# Patient Record
Sex: Female | Born: 1950 | Race: White | Hispanic: No | Marital: Married
Health system: Southern US, Community
[De-identification: ages and names within clinical notes are randomized; demographics above are authoritative.]

---

## 2003-06-10 ENCOUNTER — Other Ambulatory Visit: Admission: RE | Admit: 2003-06-10 | Discharge: 2003-06-10 | Payer: Self-pay | Admitting: Family Medicine

## 2008-03-04 ENCOUNTER — Other Ambulatory Visit: Admission: RE | Admit: 2008-03-04 | Discharge: 2008-03-04 | Payer: Self-pay | Admitting: Obstetrics and Gynecology

## 2009-04-14 ENCOUNTER — Other Ambulatory Visit: Admission: RE | Admit: 2009-04-14 | Discharge: 2009-04-14 | Payer: Self-pay | Admitting: Family Medicine

## 2012-02-24 ENCOUNTER — Other Ambulatory Visit: Payer: Self-pay | Admitting: Family Medicine

## 2012-02-24 ENCOUNTER — Other Ambulatory Visit (HOSPITAL_COMMUNITY)
Admission: RE | Admit: 2012-02-24 | Discharge: 2012-02-24 | Disposition: A | Discharge status: Home / Self Care | Payer: Managed Care, Other (non HMO) | Source: Ambulatory Visit | Attending: Family Medicine | Admitting: Family Medicine

## 2012-02-24 DIAGNOSIS — Z0000 Encounter for general adult medical examination without abnormal findings: Secondary | ICD-10-CM | POA: Insufficient documentation

## 2015-09-02 ENCOUNTER — Ambulatory Visit
Admission: RE | Admit: 2015-09-02 | Discharge: 2015-09-02 | Disposition: A | Discharge status: Home / Self Care | Payer: BLUE CROSS/BLUE SHIELD | Source: Ambulatory Visit | Attending: Family Medicine | Admitting: Family Medicine

## 2015-09-02 ENCOUNTER — Other Ambulatory Visit: Payer: Self-pay | Admitting: Family Medicine

## 2015-09-02 DIAGNOSIS — R0602 Shortness of breath: Secondary | ICD-10-CM

## 2015-09-30 ENCOUNTER — Institutional Professional Consult (permissible substitution): Payer: BLUE CROSS/BLUE SHIELD | Admitting: Internal Medicine

## 2015-10-13 ENCOUNTER — Institutional Professional Consult (permissible substitution): Payer: BLUE CROSS/BLUE SHIELD | Admitting: Internal Medicine

## 2017-02-08 DIAGNOSIS — Z23 Encounter for immunization: Secondary | ICD-10-CM | POA: Diagnosis not present

## 2017-02-15 DIAGNOSIS — Z1231 Encounter for screening mammogram for malignant neoplasm of breast: Secondary | ICD-10-CM | POA: Diagnosis not present

## 2017-06-02 DIAGNOSIS — M25551 Pain in right hip: Secondary | ICD-10-CM | POA: Diagnosis not present

## 2017-06-02 DIAGNOSIS — E785 Hyperlipidemia, unspecified: Secondary | ICD-10-CM | POA: Diagnosis not present

## 2017-06-02 DIAGNOSIS — H93A9 Pulsatile tinnitus, unspecified ear: Secondary | ICD-10-CM | POA: Diagnosis not present

## 2017-06-02 DIAGNOSIS — E559 Vitamin D deficiency, unspecified: Secondary | ICD-10-CM | POA: Diagnosis not present

## 2017-06-06 ENCOUNTER — Other Ambulatory Visit: Payer: Self-pay | Admitting: Family Medicine

## 2017-06-06 DIAGNOSIS — R0989 Other specified symptoms and signs involving the circulatory and respiratory systems: Secondary | ICD-10-CM

## 2017-06-08 ENCOUNTER — Ambulatory Visit
Admission: RE | Admit: 2017-06-08 | Discharge: 2017-06-08 | Disposition: A | Discharge status: Home / Self Care | Payer: BLUE CROSS/BLUE SHIELD | Source: Ambulatory Visit | Attending: Family Medicine | Admitting: Family Medicine

## 2017-06-08 DIAGNOSIS — I6523 Occlusion and stenosis of bilateral carotid arteries: Secondary | ICD-10-CM | POA: Diagnosis not present

## 2017-06-08 DIAGNOSIS — R0989 Other specified symptoms and signs involving the circulatory and respiratory systems: Secondary | ICD-10-CM

## 2017-06-14 ENCOUNTER — Other Ambulatory Visit: Payer: Self-pay | Admitting: Family Medicine

## 2017-06-14 DIAGNOSIS — H93A1 Pulsatile tinnitus, right ear: Secondary | ICD-10-CM

## 2017-07-18 ENCOUNTER — Ambulatory Visit
Admission: RE | Admit: 2017-07-18 | Discharge: 2017-07-18 | Disposition: A | Discharge status: Home / Self Care | Payer: BLUE CROSS/BLUE SHIELD | Source: Ambulatory Visit | Attending: Family Medicine | Admitting: Family Medicine

## 2017-07-18 DIAGNOSIS — H93A1 Pulsatile tinnitus, right ear: Secondary | ICD-10-CM | POA: Diagnosis not present

## 2018-01-23 ENCOUNTER — Other Ambulatory Visit: Payer: Self-pay | Admitting: Family Medicine

## 2018-01-23 ENCOUNTER — Other Ambulatory Visit (HOSPITAL_COMMUNITY)
Admission: RE | Admit: 2018-01-23 | Discharge: 2018-01-23 | Disposition: A | Discharge status: Home / Self Care | Payer: BLUE CROSS/BLUE SHIELD | Source: Ambulatory Visit | Attending: Family Medicine | Admitting: Family Medicine

## 2018-01-23 DIAGNOSIS — Z0000 Encounter for general adult medical examination without abnormal findings: Secondary | ICD-10-CM | POA: Diagnosis not present

## 2018-01-23 DIAGNOSIS — M8588 Other specified disorders of bone density and structure, other site: Secondary | ICD-10-CM | POA: Diagnosis not present

## 2018-01-23 DIAGNOSIS — Z13220 Encounter for screening for lipoid disorders: Secondary | ICD-10-CM | POA: Diagnosis not present

## 2018-01-23 DIAGNOSIS — Z124 Encounter for screening for malignant neoplasm of cervix: Secondary | ICD-10-CM | POA: Insufficient documentation

## 2018-01-23 DIAGNOSIS — E559 Vitamin D deficiency, unspecified: Secondary | ICD-10-CM | POA: Diagnosis not present

## 2018-01-23 DIAGNOSIS — Z23 Encounter for immunization: Secondary | ICD-10-CM | POA: Diagnosis not present

## 2018-01-25 LAB — CYTOLOGY - PAP: DIAGNOSIS: NEGATIVE

## 2018-02-01 DIAGNOSIS — M21612 Bunion of left foot: Secondary | ICD-10-CM | POA: Diagnosis not present

## 2018-02-20 DIAGNOSIS — R300 Dysuria: Secondary | ICD-10-CM | POA: Diagnosis not present

## 2018-02-20 DIAGNOSIS — N390 Urinary tract infection, site not specified: Secondary | ICD-10-CM | POA: Diagnosis not present

## 2018-03-08 DIAGNOSIS — M8588 Other specified disorders of bone density and structure, other site: Secondary | ICD-10-CM | POA: Diagnosis not present

## 2018-03-12 DIAGNOSIS — R51 Headache: Secondary | ICD-10-CM | POA: Diagnosis not present

## 2018-03-16 DIAGNOSIS — L718 Other rosacea: Secondary | ICD-10-CM | POA: Diagnosis not present

## 2018-03-16 DIAGNOSIS — L821 Other seborrheic keratosis: Secondary | ICD-10-CM | POA: Diagnosis not present

## 2018-04-06 IMAGING — US US CAROTID DUPLEX BILAT
1 series · 13 of 24 positions shown · non-contrast
Comparison: NONE AVAILABLE

CLINICAL DATA: ASYMPTOMATIC CAROTID BRUIT

EXAM:
BILATERAL CAROTID DUPLEX ULTRASOUND
TECHNIQUE: Gray scale imaging, color Doppler and duplex ultrasound were
performed of bilateral carotid and vertebral arteries in the neck.

[Series 1: us carotid duplex bilat · 0.06mm/px · 69 acquisitions, 13 frames shown]
[im 1/69]
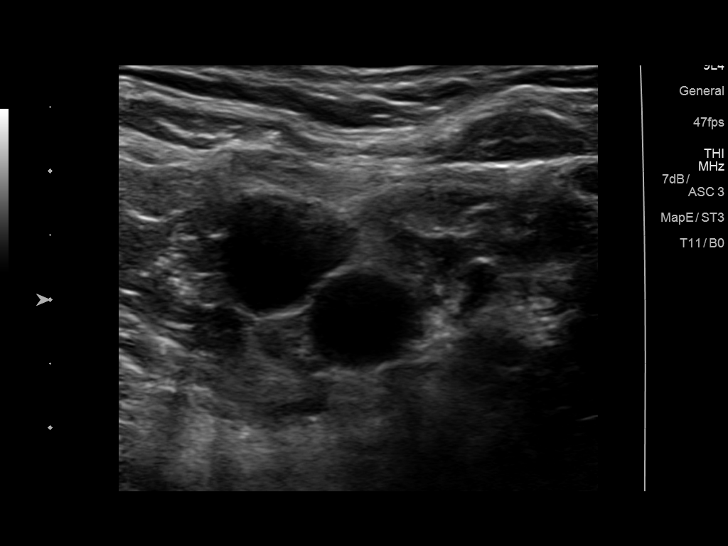
[im 6/69]
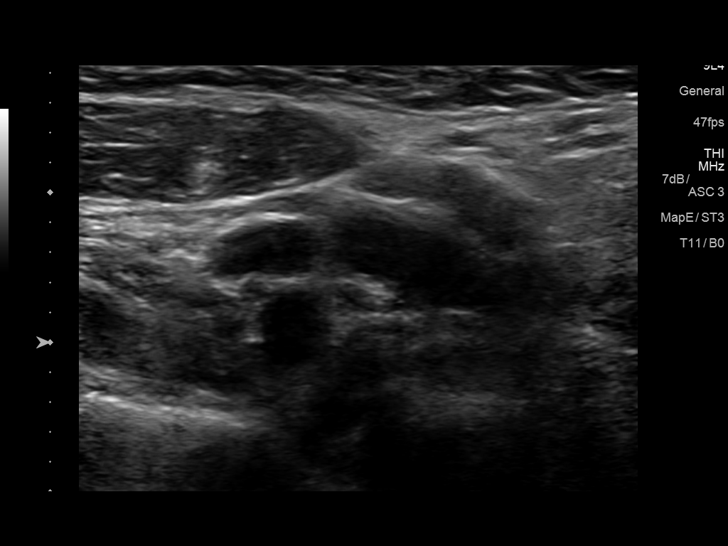
[im 12/69]
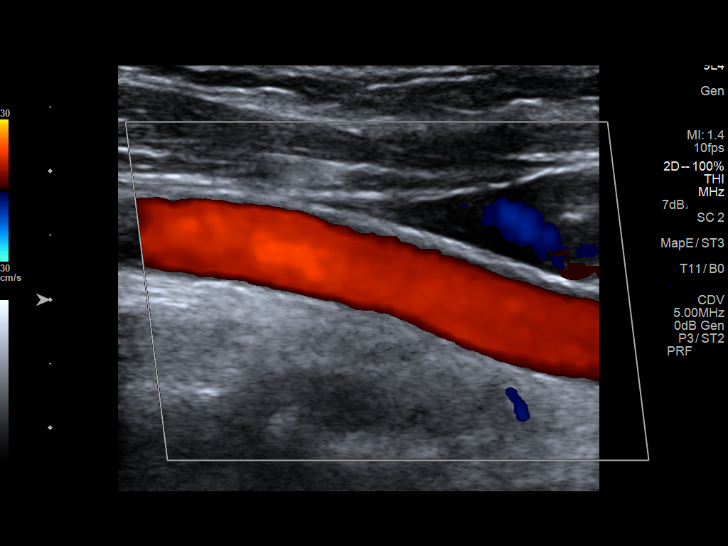
[im 18/69]
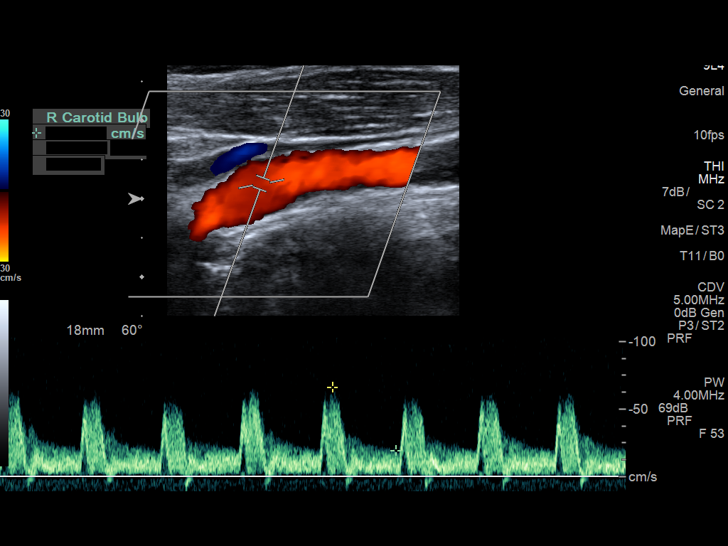
[im 24/69]
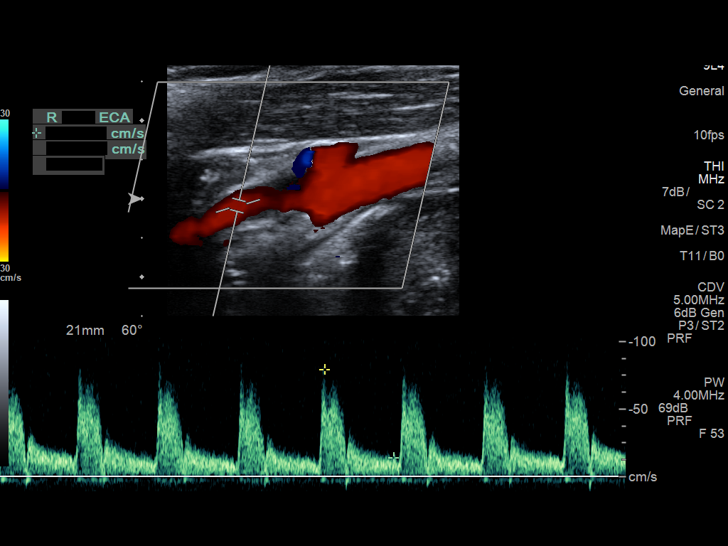
[im 30/69]
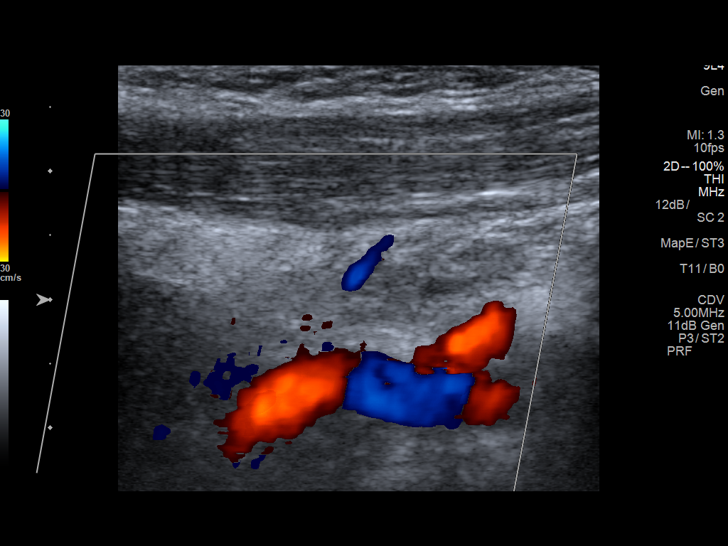
[im 39/69]
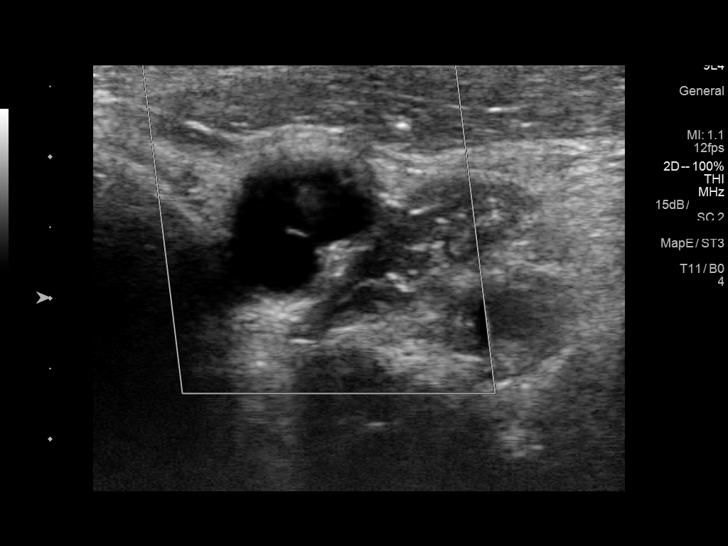
[im 42/69]
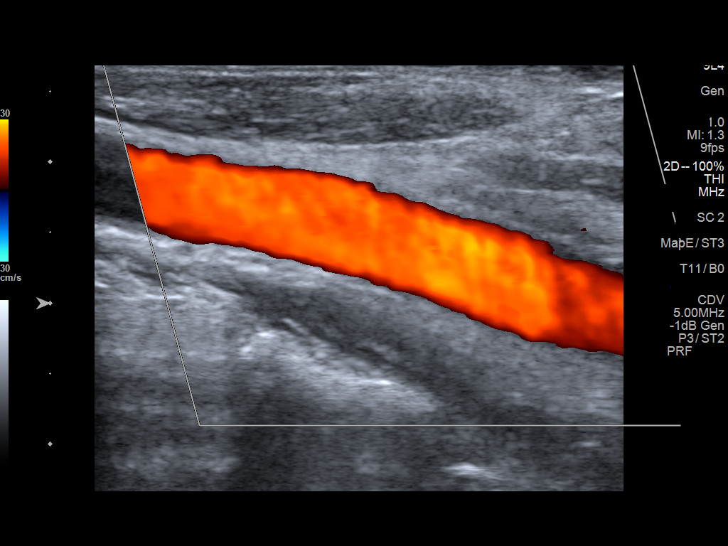
[im 48/69]
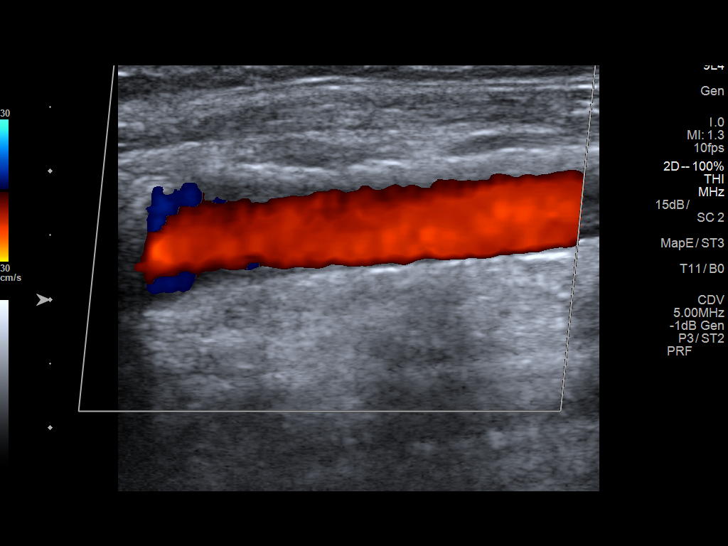
[im 54/69]
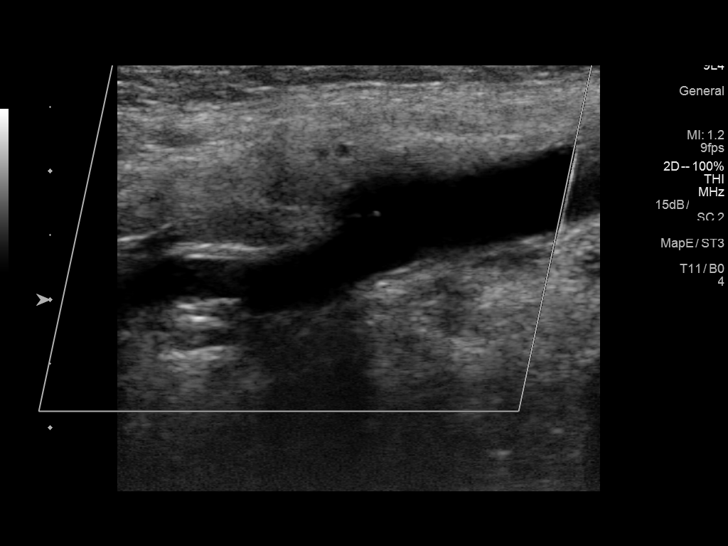
[im 57/69]
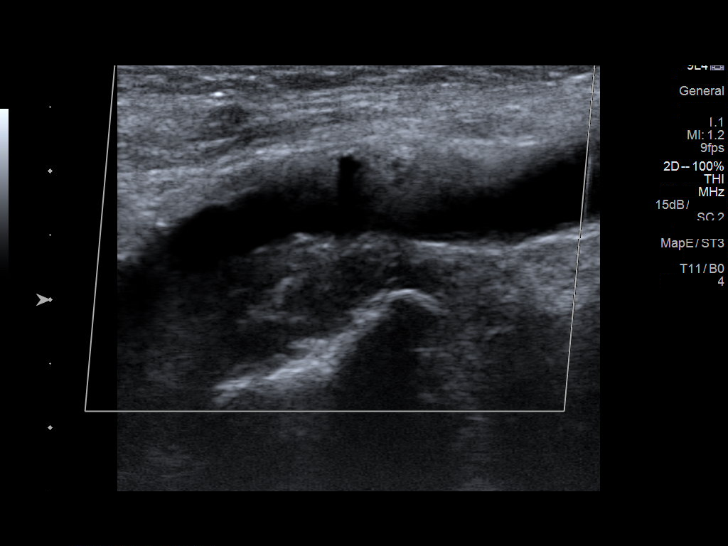
[im 63/69]
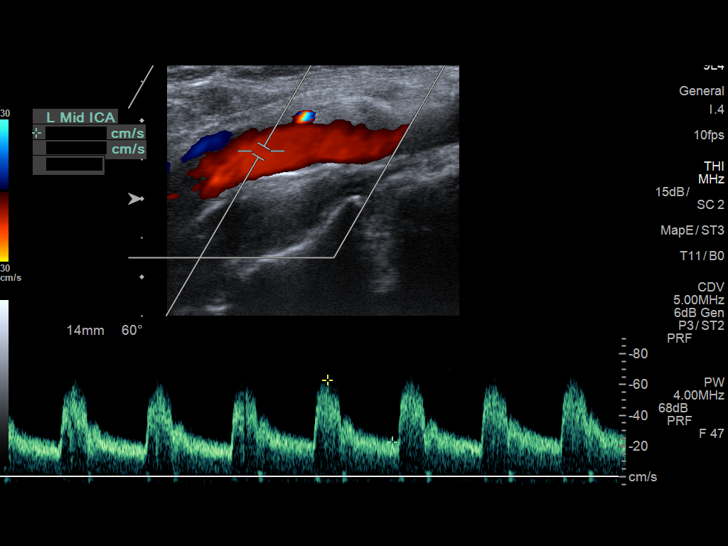
[im 69/69]
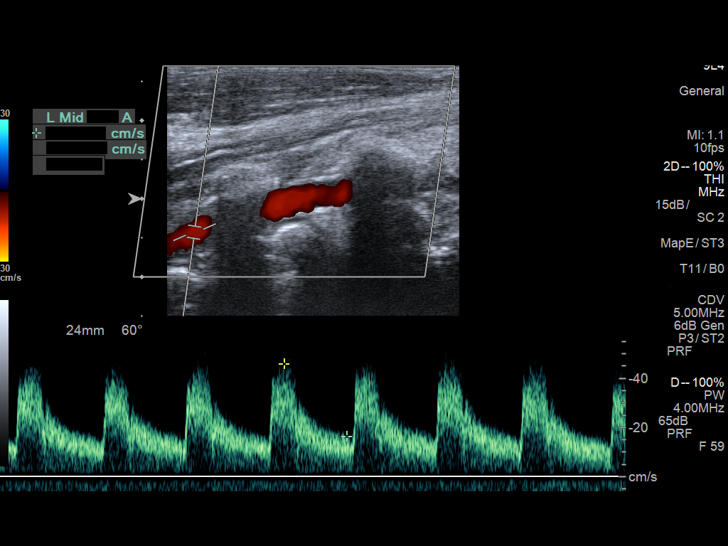

[13 of 24 positions shown; findings below may reference images not displayed]

FINDINGS: Criteria: Quantification of carotid stenosis is based on velocity
parameters that correlate the residual internal carotid diameter
with NASCET-based stenosis levels, using the diameter of the distal
internal carotid lumen as the denominator for stenosis measurement.

The following velocity measurements were obtained:

RIGHT

ICA:  123/41 cm/sec

CCA:  100/25 cm/sec

SYSTOLIC ICA/CCA RATIO:

DIASTOLIC ICA/CCA RATIO:

ECA:  79 cm/sec

LEFT

ICA:  91/34 cm/sec

CCA:  91/23 cm/sec

SYSTOLIC ICA/CCA RATIO:

DIASTOLIC ICA/CCA RATIO:

ECA:  83 cm/sec

RIGHT CAROTID ARTERY: Minor echogenic shadowing plaque formation. No
hemodynamically significant right ICA stenosis, velocity elevation,
or turbulent flow. Degree of narrowing less than 50%.

RIGHT VERTEBRAL ARTERY:  ANTEGRADE

LEFT CAROTID ARTERY: moderate heterogeneous partially calcified
right carotid bifurcation atherosclerosis. left proximal ICA appears
to have small ulcerated plaque formations. despite this, no
hemodynamically significant ICA stenosis, velocity elevation, or
turbulent flow. degree of narrowing still estimated less than 50%.

LEFT VERTEBRAL ARTERY:  ANTEGRADE
IMPRESSION: Left greater than right carotid atherosclerosis.

Mild ICA narrowings bilaterally, less than 50%.  See above comment.

Patent antegrade vertebral flow bilaterally

## 2018-06-06 DIAGNOSIS — K5730 Diverticulosis of large intestine without perforation or abscess without bleeding: Secondary | ICD-10-CM | POA: Diagnosis not present

## 2018-06-21 DIAGNOSIS — K635 Polyp of colon: Secondary | ICD-10-CM | POA: Diagnosis not present

## 2018-06-21 DIAGNOSIS — K5750 Diverticulosis of both small and large intestine without perforation or abscess without bleeding: Secondary | ICD-10-CM | POA: Diagnosis not present

## 2018-06-21 DIAGNOSIS — Z86010 Personal history of colonic polyps: Secondary | ICD-10-CM | POA: Diagnosis not present

## 2018-06-21 DIAGNOSIS — Z1211 Encounter for screening for malignant neoplasm of colon: Secondary | ICD-10-CM | POA: Diagnosis not present

## 2018-06-21 DIAGNOSIS — D122 Benign neoplasm of ascending colon: Secondary | ICD-10-CM | POA: Diagnosis not present

## 2018-06-21 DIAGNOSIS — K5730 Diverticulosis of large intestine without perforation or abscess without bleeding: Secondary | ICD-10-CM | POA: Diagnosis not present

## 2018-07-11 DIAGNOSIS — J385 Laryngeal spasm: Secondary | ICD-10-CM | POA: Diagnosis not present

## 2018-07-11 DIAGNOSIS — J387 Other diseases of larynx: Secondary | ICD-10-CM | POA: Diagnosis not present

## 2018-07-11 DIAGNOSIS — J384 Edema of larynx: Secondary | ICD-10-CM | POA: Diagnosis not present

## 2018-07-11 DIAGNOSIS — R490 Dysphonia: Secondary | ICD-10-CM | POA: Diagnosis not present

## 2018-07-11 DIAGNOSIS — R498 Other voice and resonance disorders: Secondary | ICD-10-CM | POA: Diagnosis not present

## 2018-07-11 DIAGNOSIS — J383 Other diseases of vocal cords: Secondary | ICD-10-CM | POA: Diagnosis not present

## 2018-08-03 DIAGNOSIS — R350 Frequency of micturition: Secondary | ICD-10-CM | POA: Diagnosis not present

## 2018-10-05 DIAGNOSIS — Z23 Encounter for immunization: Secondary | ICD-10-CM | POA: Diagnosis not present

## 2018-10-31 DIAGNOSIS — R300 Dysuria: Secondary | ICD-10-CM | POA: Diagnosis not present

## 2018-10-31 DIAGNOSIS — N3090 Cystitis, unspecified without hematuria: Secondary | ICD-10-CM | POA: Diagnosis not present

## 2019-02-26 DIAGNOSIS — Z0000 Encounter for general adult medical examination without abnormal findings: Secondary | ICD-10-CM | POA: Diagnosis not present

## 2019-02-26 DIAGNOSIS — E559 Vitamin D deficiency, unspecified: Secondary | ICD-10-CM | POA: Diagnosis not present

## 2019-02-26 DIAGNOSIS — Z1159 Encounter for screening for other viral diseases: Secondary | ICD-10-CM | POA: Diagnosis not present

## 2019-02-26 DIAGNOSIS — Z23 Encounter for immunization: Secondary | ICD-10-CM | POA: Diagnosis not present

## 2019-02-26 DIAGNOSIS — E785 Hyperlipidemia, unspecified: Secondary | ICD-10-CM | POA: Diagnosis not present

## 2019-07-04 ENCOUNTER — Ambulatory Visit: Payer: Medicare Other | Attending: Internal Medicine

## 2019-07-04 DIAGNOSIS — Z23 Encounter for immunization: Secondary | ICD-10-CM | POA: Insufficient documentation

## 2019-07-04 NOTE — Progress Notes (Signed)
   Covid-19 Vaccination Clinic  Name:  Alejandra Perez    MRN: [id number] DOB: 09-24-1950  07/04/2019  [name] was observed post Covid-19 immunization for 15 minutes without incidence. She was provided with Vaccine Information Sheet and instruction to access the V-Safe system.   [name] was instructed to call 911 with any severe reactions post vaccine: Marland Kitchen Difficulty breathing  . Swelling of your face and throat  . A fast heartbeat  . A bad rash all over your body  . Dizziness and weakness    Immunizations Administered    Name Date Dose VIS Date Route   Pfizer COVID-19 Vaccine 07/04/2019 11:23 AM 0.3 mL 04/19/2019 Intramuscular   Manufacturer: [employer], [employer]   Lot: [id number]   NDC: [address]-7

## 2019-07-30 ENCOUNTER — Ambulatory Visit: Payer: Medicare Other | Attending: Internal Medicine

## 2019-07-30 DIAGNOSIS — Z23 Encounter for immunization: Secondary | ICD-10-CM

## 2019-07-30 NOTE — Progress Notes (Signed)
   Covid-19 Vaccination Clinic  Name:  Alejandra Perez    MRN: [id number] DOB: Sep 05, 1950  07/30/2019  [name] was observed post Covid-19 immunization for 15 minutes without incident. She was provided with Vaccine Information Sheet and instruction to access the V-Safe system.   Ms. Risk was instructed to call 911 with any severe reactions post vaccine: Marland Kitchen Difficulty breathing  . Swelling of face and throat  . A fast heartbeat  . A bad rash all over body  . Dizziness and weakness   Immunizations Administered    Name Date Dose VIS Date Route   Pfizer COVID-19 Vaccine 07/30/2019 10:49 AM 0.3 mL 04/19/2019 Intramuscular   Manufacturer: [employer], [employer]   Lot: [id number]   NDC: [address]-7

## 2019-10-28 DIAGNOSIS — K529 Noninfective gastroenteritis and colitis, unspecified: Secondary | ICD-10-CM

## 2019-10-28 DIAGNOSIS — R9431 Abnormal electrocardiogram [ECG] [EKG]: Secondary | ICD-10-CM | POA: Diagnosis not present

## 2019-10-28 DIAGNOSIS — R55 Syncope and collapse: Secondary | ICD-10-CM | POA: Diagnosis not present

## 2019-10-28 DIAGNOSIS — D72829 Elevated white blood cell count, unspecified: Secondary | ICD-10-CM | POA: Diagnosis not present

## 2019-10-28 DIAGNOSIS — R1013 Epigastric pain: Secondary | ICD-10-CM | POA: Diagnosis not present

## 2019-10-28 DIAGNOSIS — M419 Scoliosis, unspecified: Secondary | ICD-10-CM | POA: Diagnosis not present

## 2019-10-28 DIAGNOSIS — Z79899 Other long term (current) drug therapy: Secondary | ICD-10-CM | POA: Diagnosis not present

## 2019-10-28 DIAGNOSIS — R7989 Other specified abnormal findings of blood chemistry: Secondary | ICD-10-CM | POA: Diagnosis not present

## 2019-10-28 DIAGNOSIS — Z9049 Acquired absence of other specified parts of digestive tract: Secondary | ICD-10-CM | POA: Diagnosis not present

## 2019-10-28 NOTE — ED Notes (Signed)
Pt reports she feels like she is going to pass out, abd pain with NVD.  Started tonight after dinner

## 2019-10-29 ENCOUNTER — Emergency Department: Admit: 2019-10-29 | Payer: MEDICARE | Primary: Student in an Organized Health Care Education/Training Program

## 2019-10-29 ENCOUNTER — Inpatient Hospital Stay
Admit: 2019-10-29 | Discharge: 2019-10-29 | Disposition: A | Discharge status: Home / Self Care | Payer: MEDICARE | Attending: Emergency Medicine

## 2019-10-29 DIAGNOSIS — K529 Noninfective gastroenteritis and colitis, unspecified: Secondary | ICD-10-CM | POA: Diagnosis not present

## 2019-10-29 DIAGNOSIS — D72829 Elevated white blood cell count, unspecified: Secondary | ICD-10-CM | POA: Diagnosis not present

## 2019-10-29 LAB — COMPREHENSIVE METABOLIC PANEL
ALT: 29 U/L (ref 12–78)
AST: 27 U/L (ref 15–37)
Albumin: 4 gm/dl (ref 3.4–5.0)
Alkaline Phosphatase: 57 U/L (ref 45–117)
Anion Gap: 7 mmol/L (ref 5–15)
BUN: 16 mg/dl (ref 7–25)
CO2: 27 mEq/L (ref 21–32)
Calcium: 9.3 mg/dl (ref 8.5–10.1)
Chloride: 109 mEq/L — ABNORMAL HIGH (ref 98–107)
Creatinine: 1 mg/dl (ref 0.6–1.3)
EGFR IF NonAfrican American: 58
GFR African American: >60
Glucose: 171 mg/dl — ABNORMAL HIGH (ref 74–106)
Potassium: 3.7 mEq/L (ref 3.5–5.1)
Sodium: 143 mEq/L (ref 136–145)
Total Bilirubin: 0.4 mg/dl (ref 0.2–1.0)
Total Protein: 7.1 gm/dl (ref 6.4–8.2)

## 2019-10-29 LAB — LACTIC ACID
LACTIC ACID: 2.3 mmol/L — ABNORMAL HIGH (ref 0.4–2.0)
LACTIC ACID: 1 mmol/L (ref 0.4–2.0)
Lactic Acid: 2.3 mmol/L — ABNORMAL HIGH (ref 0.4–2.0)
Lactic Acid: 1 mmol/L (ref 0.4–2.0)

## 2019-10-29 LAB — EKG 12-LEAD
Atrial Rate: 86 {beats}/min
Diagnosis: NORMAL
P Axis: 80 degrees
P-R Interval: 122 ms
Q-T Interval: 384 ms
QRS Duration: 78 ms
QTc Calculation (Bazett): 459 ms
R Axis: 78 degrees
T Axis: -18 degrees
Ventricular Rate: 86 {beats}/min

## 2019-10-29 LAB — CBC WITH AUTO DIFFERENTIAL
Basophils %: 0.2 % (ref 0–3)
Eosinophils %: 0.7 % (ref 0–5)
Hematocrit: 47.9 % (ref 37.0–50.0)
Hemoglobin: 16.2 gm/dl (ref 13.0–17.2)
Immature Granulocytes: 0.5 % (ref 0.0–3.0)
Lymphocytes %: 7.5 % — ABNORMAL LOW (ref 28–48)
MCH: 31.6 pg (ref 25.4–34.6)
MCHC: 33.8 gm/dl (ref 30.0–36.0)
MCV: 93.6 fL (ref 80.0–98.0)
MPV: 10.2 fL — ABNORMAL HIGH (ref 6.0–10.0)
Monocytes %: 4.8 % (ref 1–13)
Neutrophils %: 86.3 % — ABNORMAL HIGH (ref 34–64)
Nucleated RBCs: 0 (ref 0–0)
Platelets: 274 10*3/uL (ref 140–450)
RBC: 5.12 M/uL (ref 3.60–5.20)
RDW-SD: 48.2 — ABNORMAL HIGH (ref 36.4–46.3)
WBC: 21 10*3/uL — ABNORMAL HIGH (ref 4.0–11.0)

## 2019-10-29 LAB — TROPONIN
Troponin I: <0.015 ng/ml (ref 0.000–0.045)
Troponin I: <0.015 ng/ml (ref 0.000–0.045)
Troponin I: <0.015 ng/ml (ref 0.000–0.045)

## 2019-10-29 LAB — POCT GLUCOSE: POC Glucose: 135 mg/dL — ABNORMAL HIGH (ref 65–105)

## 2019-10-29 LAB — LIPASE
Lipase: 95 U/L (ref 73–393)
Lipase: 95 U/L (ref 73–393)

## 2019-10-29 LAB — AMB POC CREATININE: Creatinine: 1 mg/dl (ref 0.6–1.3)

## 2019-10-29 LAB — METABOLIC PANEL, COMPREHENSIVE
ALT (SGPT): 29 U/L (ref 12–78)
AST (SGOT): 27 U/L (ref 15–37)
Albumin: 4 gm/dl (ref 3.4–5.0)
Alk. phosphatase: 57 U/L (ref 45–117)
Anion gap: 7 mmol/L (ref 5–15)
BUN: 16 mg/dl (ref 7–25)
Bilirubin, total: 0.4 mg/dl (ref 0.2–1.0)
CO2: 27 mEq/L (ref 21–32)
Calcium: 9.3 mg/dl (ref 8.5–10.1)
Chloride: 109 mEq/L — ABNORMAL HIGH (ref 98–107)
Creatinine: 1 mg/dl (ref 0.6–1.3)
GFR est AA: >60
GFR est non-AA: 58
Glucose: 171 mg/dl — ABNORMAL HIGH (ref 74–106)
Potassium: 3.7 mEq/L (ref 3.5–5.1)
Protein, total: 7.1 gm/dl (ref 6.4–8.2)
Sodium: 143 mEq/L (ref 136–145)

## 2019-10-29 LAB — CBC WITH AUTOMATED DIFF
BASOPHILS: 0.2 % (ref 0–3)
EOSINOPHILS: 0.7 % (ref 0–5)
HCT: 47.9 % (ref 37.0–50.0)
HGB: 16.2 gm/dl (ref 13.0–17.2)
IMMATURE GRANULOCYTES: 0.5 % (ref 0.0–3.0)
LYMPHOCYTES: 7.5 % — ABNORMAL LOW (ref 28–48)
MCH: 31.6 pg (ref 25.4–34.6)
MCHC: 33.8 gm/dl (ref 30.0–36.0)
MCV: 93.6 fL (ref 80.0–98.0)
MONOCYTES: 4.8 % (ref 1–13)
MPV: 10.2 fL — ABNORMAL HIGH (ref 6.0–10.0)
NEUTROPHILS: 86.3 % — ABNORMAL HIGH (ref 34–64)
NRBC: 0 (ref 0–0)
PLATELET: 274 10*3/uL (ref 140–450)
RBC: 5.12 M/uL (ref 3.60–5.20)
RDW-SD: 48.2 — ABNORMAL HIGH (ref 36.4–46.3)
WBC: 21 10*3/uL — ABNORMAL HIGH (ref 4.0–11.0)

## 2019-10-29 LAB — EKG, 12 LEAD, INITIAL
Atrial Rate: 86 {beats}/min
Calculated P Axis: 80 degrees
Calculated R Axis: 78 degrees
Calculated T Axis: -18 degrees
Diagnosis: NORMAL
P-R Interval: 122 ms
Q-T Interval: 384 ms
QRS Duration: 78 ms
QTC Calculation (Bezet): 459 ms
Ventricular Rate: 86 {beats}/min

## 2019-10-29 LAB — TROPONIN I
Troponin-I: <0.015 ng/ml (ref 0.000–0.045)
Troponin-I: <0.015 ng/ml (ref 0.000–0.045)
Troponin-I: <0.015 ng/ml (ref 0.000–0.045)

## 2019-10-29 LAB — GLUCOSE, POC: Glucose (POC): 135 mg/dL — ABNORMAL HIGH (ref 65–105)

## 2019-10-29 LAB — CREATININE, POC: Creatinine: 1 mg/dl (ref 0.6–1.3)

## 2019-10-29 MED ORDER — SODIUM CHLORIDE 0.9% BOLUS IV
0.9 % | INTRAVENOUS | Status: DC
Start: 2019-10-29 — End: 2019-10-29

## 2019-10-29 MED ORDER — ACETAMINOPHEN 650 MG RECTAL SUPPOSITORY
650 mg | RECTAL | Status: DC | PRN
Start: 2019-10-29 — End: 2019-10-30

## 2019-10-29 MED ORDER — NALOXONE 0.4 MG/ML INJECTION
0.4 mg/mL | INTRAMUSCULAR | Status: DC | PRN
Start: 2019-10-29 — End: 2019-10-30

## 2019-10-29 MED ORDER — SODIUM CHLORIDE 0.9% BOLUS IV
0.9 % | INTRAVENOUS | Status: AC
Start: 2019-10-29 — End: 2019-10-29
  Administered 2019-10-29: 08:00:00 via INTRAVENOUS

## 2019-10-29 MED ORDER — ACETAMINOPHEN (TYLENOL) SOLUTION 32MG/ML
ORAL | Status: DC | PRN
Start: 2019-10-29 — End: 2019-10-30

## 2019-10-29 MED ORDER — ASPIRIN 81 MG CHEWABLE TAB
81 mg | ORAL | Status: AC
Start: 2019-10-29 — End: 2019-10-29
  Administered 2019-10-29: 09:00:00 via ORAL

## 2019-10-29 MED ORDER — ASPIRIN 81 MG CHEWABLE TAB
81 mg | ORAL | Status: AC
Start: 2019-10-29 — End: 2019-10-29

## 2019-10-29 MED ORDER — ACETAMINOPHEN 325 MG TABLET
325 mg | ORAL | Status: DC | PRN
Start: 2019-10-29 — End: 2019-10-30

## 2019-10-29 MED ORDER — SODIUM CHLORIDE 0.9 % IV
INTRAVENOUS | Status: DC
Start: 2019-10-29 — End: 2019-10-29
  Administered 2019-10-29: 11:00:00 via INTRAVENOUS

## 2019-10-29 MED ORDER — SODIUM CHLORIDE 0.9 % IV
INTRAVENOUS | Status: AC
Start: 2019-10-29 — End: 2019-10-29
  Administered 2019-10-29: 13:00:00 via INTRAVENOUS

## 2019-10-29 MED ORDER — ONDANSETRON (PF) 4 MG/2 ML INJECTION
4 mg/2 mL | Freq: Four times a day (QID) | INTRAMUSCULAR | Status: DC | PRN
Start: 2019-10-29 — End: 2019-10-30

## 2019-10-29 MED ORDER — SODIUM CHLORIDE 0.9 % IV
Freq: Once | INTRAVENOUS | Status: AC
Start: 2019-10-29 — End: 2019-10-29
  Administered 2019-10-29: 06:00:00 via INTRAVENOUS

## 2019-10-29 MED ORDER — SODIUM CHLORIDE 0.9 % IJ SYRG
Freq: Once | INTRAMUSCULAR | Status: AC
Start: 2019-10-29 — End: 2019-10-29
  Administered 2019-10-29: 06:00:00 via INTRAVENOUS

## 2019-10-29 MED ORDER — ENOXAPARIN 40 MG/0.4 ML SUB-Q SYRINGE
40 mg/0.4 mL | SUBCUTANEOUS | Status: DC
Start: 2019-10-29 — End: 2019-10-30

## 2019-10-29 MED ORDER — TRAMADOL 50 MG TAB
50 mg | Freq: Four times a day (QID) | ORAL | Status: DC | PRN
Start: 2019-10-29 — End: 2019-10-30

## 2019-10-29 MED FILL — SODIUM CHLORIDE 0.9 % IV: INTRAVENOUS | Qty: 1000

## 2019-10-29 MED FILL — ENOXAPARIN 40 MG/0.4 ML SUB-Q SYRINGE: 40 mg/0.4 mL | SUBCUTANEOUS | Qty: 0.4

## 2019-10-29 MED FILL — ASPIRIN 81 MG CHEWABLE TAB: 81 mg | ORAL | Qty: 2

## 2019-10-29 NOTE — Progress Notes (Signed)
Progress  Notes by Amelia Jo D at 10/29/19 1610                Author: Amelia Jo D  Service: NURSING  Author Type: Registered Nurse       Filed: 10/29/19 0647  Date of Service: 10/29/19 0647  Status: Signed          Editor: Rod Can (Registered Nurse)               Physical Exam   Skin:      General: Skin is warm and dry.

## 2019-10-29 NOTE — Progress Notes (Signed)
Bedside shift change report given to Turner Daniels.   (Cabin crew) by [place], RN (offgoing nurse). Report included the following information SBAR and Kardex.

## 2019-10-29 NOTE — ED Notes (Signed)
 TRANSFER - OUT REPORT:    Verbal report given on Tina Reilly  being transferred to 6W (unit) room [phone] for routine progression of care       Report consisted of patient's Situation, Background, Assessment and   Recommendations(SBAR).     Information from the following report(s) ED Summary and Recent Results was reviewed with the receiving nurse.    Lines:   Peripheral IV 10/29/19 Left Antecubital (Active)        Opportunity for questions and clarification was provided.      Patient transported with:   Monitor  Registered Nurse

## 2019-10-29 NOTE — ED Provider Notes (Signed)
ED Provider Notes by Fuller Song, PA-C at 10/29/19 0401                Author: Fuller Song, PA-C  Service: EMERGENCY  Author Type: Physician Assistant       Filed: 10/29/19 0457  Date of Service: 10/29/19 0401  Status: Attested Addendum          Editor: Sanjuan Dame (Physician Assistant)       Related Notes: Original Note by Sanjuan Dame (Physician Assistant) filed at 10/29/19  [phone]          Cosigner: Mora Bellman, MD at 10/30/19 [phone]          Attestation signed by Mora Bellman, MD at 10/30/19 [phone]          Addendum by Dr. Jacklynn Barnacle:   I have personally seen and examined this patient; I have fully participated in the care of this patient with the advanced practice provider.  I have reviewed and agree with all pertinent clinical information including history, physical exam, labs, radiographic  studies and the plan.    Very well-appearing female with complaints as below, EKG shows acute changes with inferior lateral to his inversions but are reassuring troponin.  As-impressive leukocytosis, CT scan with enteritis, given her acute EKG findings, white count, do not feel  discharge home is in her best interest.                                 [facility]   Emergency Department Treatment Report          Patient: Tina Reilly  Age: 69 y.o.  Sex: female          Date of Birth: 1950/10/02  Admit Date: 10/28/2019  PCP: Unknown, Provider     MRN: [id number]   CSN: [id number]   Attn: Mora Bellman, MD         Room: ER08/ER08  Time Dictated: 4:01 AM  APP: Karle Starch. Maycen Degregory, PA-C           Chief Complaint    Abdominal pain     History of Present Illness     69 y.o. female  presents stating that she ate fried seafood this evening around 1900 hrs. she was leaving and she felt like what she thought was "acid reflux" she describes kind of is a fullness in her upper abdomen which she has had before with reflux however this  turned to pain and then started to radiate  to her back she did feel short of breath but she was cold and clammy and nauseated felt like she was going to pass out and then had diarrhea which seemed to make it worse.  She states the pain lasted a good 4  hours before it subsided.  She states she really does not feel the pain at all now she has no complaints at this time.  She is not had symptoms like this before.  She did not take any medications at home.  Denies any leg pain or swelling.        Review of Systems     Review of Systems    Constitutional: Positive for diaphoresis.    Respiratory: Negative for cough and shortness of breath.     Cardiovascular: Positive for chest pain. Negative for leg swelling.         Pain  in the epigastrium radiating to her back did not radiate up into her chest neck jaw or arms    Gastrointestinal: Positive for abdominal pain, diarrhea  and nausea. Negative for vomiting.    Genitourinary:         No urinary symptoms    Skin: Negative for rash.    Neurological: Negative for dizziness, sensory change, focal weakness and headaches.    All other systems reviewed and are negative.           Past Medical/Surgical History     No past medical history on file.   No past surgical history on file.   Had a cholecystectomy in 1999.  Denies any history of hypertension diabetes hyperlipidemia no known heart or lung disease     Social History          Social History          Socioeconomic History         ?  Marital status:  MARRIED              Spouse name:  Not on file         ?  Number of children:  Not on file     ?  Years of education:  Not on file         ?  Highest education level:  Not on file          Social Determinants of Health          Financial Resource Strain:         ?  Difficulty of Paying Living Expenses:        Food Insecurity:         ?  Worried About Charity fundraiser in the Last Year:      ?  Arboriculturist in the Last Year:        Transportation Needs:         ?  Film/video editor (Medical):      ?  Lack of  Transportation (Non-Medical):        Physical Activity:         ?  Days of Exercise per Week:      ?  Minutes of Exercise per Session:        Stress:         ?  Feeling of Stress :        Social Connections:         ?  Frequency of Communication with Friends and Family:      ?  Frequency of Social Gatherings with Friends and Family:      ?  Attends Religious Services:      ?  Active Member of Clubs or Organizations:      ?  Attends Archivist Meetings:         ?  Marital Status:         No tobacco no recent alcohol     Family History     No family history on file.   mother had an MI in her 29s     Current Medications        Vitamins     Allergies          Allergies        Allergen  Reactions         ?  Soma [Carisoprodol]  Rash         ?  Trimethoprim  Rash             Physical Exam        Visit Vitals      BP  (!) 104/56     Pulse  79     Temp  97.7 ??F (36.5 ??C)     Resp  17        SpO2  99%        Physical Exam   Vitals reviewed.    Constitutional:        Comments: Very pleasant nontoxic   Eyes :       General: No scleral icterus.     Conjunctiva/sclera: Conjunctivae normal.      Pupils: Pupils are equal, round, and reactive to light.   Cardiovascular :       Rate and Rhythm: Normal rate and regular rhythm.      Heart sounds: No murmur heard.      Pulmonary:       Breath sounds: Normal breath sounds. No wheezing or rales.   Chest:       Chest wall: No tenderness.   Abdominal :      Palpations: Abdomen is soft.      Comments: I cannot reproduce any tenderness there is no organomegaly no masses no pulsatile masses      Musculoskeletal:      Cervical back: Neck supple. No tenderness.      Comments: Back no flank tenderness extremities are symmetric  warm and well-perfused and nontender    Skin:      General: Skin is warm and dry.   Neurological :       General: No focal deficit present.      Mental Status: She is alert and oriented to person, place, and time.    Psychiatric:         Mood and Affect: Mood  normal.         Behavior: Behavior normal.               Impression and Management Plan     69 year old female presents for the evaluation of epigastric abdominal pain radiating to her back associated with diaphoresis nausea and a feeling that she might pass out.  She states  it lasted about 4 hours and then resolved.  Denies symptoms currently.  This is a new problem for her.  At this time should be placed on blood pressure, pulse oximetry cardiac monitors EKG baseline lab work occluding CBC CMP lipase troponin will be obtained  a chest x-ray.  Will evaluate for acute hepatobiliary pancreatic cardiac source for symptoms.   Type(s) of Personal Protection Equipment (PPE) worn during exam: gown,  N95 mask, eyeshield, gloves     Diagnostic Studies     Lab:      Recent Results (from the past 12 hour(s))     LACTIC ACID          Collection Time: 10/29/19 12:50 AM         Result  Value  Ref Range            Lactic Acid  2.3 (H)  0.4 - 2.0 mmol/L       CBC WITH AUTOMATED DIFF          Collection Time: 10/29/19 12:54 AM         Result  Value  Ref Range            WBC  21.0 (H)  4.0 - 11.0 1000/mm3       RBC  5.12  3.60 - 5.20 M/uL       HGB  16.2  13.0 - 17.2 gm/dl       HCT  47.9  37.0 - 50.0 %       MCV  93.6  80.0 - 98.0 fL       MCH  31.6  25.4 - 34.6 pg       MCHC  33.8  30.0 - 36.0 gm/dl       PLATELET  274  140 - 450 1000/mm3       MPV  10.2 (H)  6.0 - 10.0 fL       RDW-SD  48.2 (H)  36.4 - 46.3         NRBC  0  0 - 0         IMMATURE GRANULOCYTES  0.5  0.0 - 3.0 %       NEUTROPHILS  86.3 (H)  34 - 64 %       LYMPHOCYTES  7.5 (L)  28 - 48 %       MONOCYTES  4.8  1 - 13 %       EOSINOPHILS  0.7  0 - 5 %       BASOPHILS  0.2  0 - 3 %       METABOLIC PANEL, COMPREHENSIVE          Collection Time: 10/29/19 12:54 AM         Result  Value  Ref Range            Sodium  143  136 - 145 mEq/L       Potassium  3.7  3.5 - 5.1 mEq/L       Chloride  109 (H)  98 - 107 mEq/L       CO2  27  21 - 32 mEq/L       Glucose  171 (H)  74 -  106 mg/dl       BUN  16  7 - 25 mg/dl       Creatinine  1.0  0.6 - 1.3 mg/dl       GFR est AA  >60.0          GFR est non-AA  58          Calcium  9.3  8.5 - 10.1 mg/dl       AST (SGOT)  27  15 - 37 U/L       ALT (SGPT)  29  12 - 78 U/L       Alk. phosphatase  57  45 - 117 U/L       Bilirubin, total  0.4  0.2 - 1.0 mg/dl       Protein, total  7.1  6.4 - 8.2 gm/dl       Albumin  4.0  3.4 - 5.0 gm/dl       Anion gap  7  5 - 15 mmol/L       LIPASE          Collection Time: 10/29/19 12:54 AM         Result  Value  Ref Range            Lipase  95  73 - 393 U/L       TROPONIN I          Collection Time: 10/29/19 12:54 AM  Result  Value  Ref Range            Troponin-I  <0.015  0.000 - 0.045 ng/ml       CREATININE, POC          Collection Time: 10/29/19  1:36 AM         Result  Value  Ref Range            Creatinine  1.0  0.6 - 1.3 mg/dl        EKG showed a sinus rhythm with rate of 86 a PR 122 QRS 78 a QT of 384 QTC of 459 T waves flipped in 3134 V5 and V6; no old EKG in our system for comparison.  No definitive evidence to suggest acute ischemia as interpreted by Dr. Gershon Crane      Cardiac monitoring was obtained with the epigastric pain and near syncope and shows the patient's has remained in a sinus rhythm with a rate in the 80s during her stay in the emergency department interpreted by Theodore Demark, PA-C         Imaging:        2 view chest x-ray no definite acute infiltrate seen as interpreted by Dr. Gershon Crane and Theodore Demark, PA-C               Patient Name:  Tina Reilly, Tina Reilly Initiated:  Oct 29, 2019 2:30:30 AM EDT     DOB:  [birth year]     Study Received:  Oct 29, 2019 2:49:17 AM EDT     MRN:  [id number]    Modality Type:  CT     Gender:  F     Description:  ABDOMEN ROUTINE_ABD_PEL_ACR (ADULT)     Accession #:  [id number]    Body Part:  abdomen      Institution[facility]  [facility]      Physician:  Carolan Shiver A      History: upper abd pain, marked leukocytosis of unclear etiology  (Hx)        PRELIMINARY RADIOLOGY REPORT    CT abdomen and pelvis:     Lung bases: No focal consolidation.   liver: Unremarkable   Gallbladder: Status post cholecystectomy   Spleen: Unremarkable   Pancreas: Unremarkable   Adrenals: Unremarkable   No kidney stone or  hydronephrosis.   Distended small bowel loops with wall thickening and mesenteric inflammation. Collapsed loops are present distally.   No evidence of colonic distention. Scattered Klein diverticula.   Normal appearance of the appendix.    Bladder and reproductive organs unremarkable.   No acute fracture.   Free fluid in the pelvis    Impression  1. Circumferential wall thickening in the small bowel with mild mesenteric edema suggest enteritis   2. Diverticulosis    3. Free fluid in the pelvis   4. Status post cholecystectomy    Electronically signed on Oct 29, 2019 4:11:16 AM EDT by:    Bayard Males, MD    Diplomate, American Board of Radiology          No results found.   Procedures        Medical Decision Making/ED Course     Findings reviewed with patient.  Remain hemodynamically stable and comfortable here no abdominal pain she is had no recurrent symptoms.  Dr. Mora Bellman evaluated examined patient.   CT abdomen and pelvis was obtained with a leukocytosis and mildly elevated lactic acid.  This returned showing an enteritis.  Patient's not have had any further symptoms.  Her EKG is normal.  She states she had an EKG in October 2020 and was normal at  her doctor's office.  With the epigastric discomfort and near syncope and T wave change seen on her EKG query whether this could be cardiac related. At this time discussed admission for further cardiac work-up as well as further evaluation of the enteritis  and monitoring of her leukocytosis and lactic acid which I think is elevated in part due to dehydration from diarrhea.  She is agreeable to staying in the hospital.      I have discussed the case with Dr. Clydene Fake who kindly excepts patient  for assignment hospital observation telemetry.. On repeat abdominal exam at this time her abdomen is completely soft and nontender     Medications       sodium chloride (NS) flush 5-10 mL (10 mL IntraVENous Given 10/29/19 0131)     0.9% sodium chloride infusion 250 mL (0 mL IntraVENous IV Completed 10/29/19 0449)     sodium chloride 0.9 % bolus infusion 750 mL (750 mL IntraVENous New Bag 10/29/19 0424)       aspirin chewable tablet 162 mg (162 mg Oral Given 10/29/19 0450)               Final Diagnosis                 ICD-10-CM  ICD-9-CM          1.  Enteritis   K52.9  558.9     2.  Leukocytosis, unspecified type   D72.829  288.60     3.  Elevated lactic acid level   R79.89  276.2     4.  Abdominal pain, epigastric   R10.13  789.06     5.  Abnormal EKG   R94.31  794.31          6.  Near syncope   R55  780.2             Disposition     Patient mated hospital observation telemetry in stable condition.  Patient examined and evaluated by myself and Dr. Gershon Crane agrees with assessment and plan.           Leatrice Jewels, PA-C   October 29, 2019      My signature above authenticates this document and my orders, the final     diagnosis (es), discharge prescription (s), and instructions in the Epic     record.   If you have any questions please contact [phone].   Dragon medical dictation software was used for portions of this report. Unintended voice recognition errors may occur.

## 2019-10-29 NOTE — Progress Notes (Signed)
 Problem: Falls - Risk of  Goal: *Absence of Falls  Description: Document Tina Reilly Fall Risk and appropriate interventions in the flowsheet.  Outcome: Progressing Towards Goal  Note: Fall Risk Interventions:            Medication Interventions: Patient to call before getting OOB, Teach patient to arise slowly         History of Falls Interventions: Door open when patient unattended         Problem: Patient Education: Go to Patient Education Activity  Goal: Patient/Family Education  Outcome: Progressing Towards Goal

## 2019-10-29 NOTE — H&P (Signed)
History & Physical    10/28/2019  Primary Care Provider: Unknown, Provider  Source of Information: Patient     Chief complaint: Abd pain, nausea and diarrhea        Assessment:    Active Problems:    Enteritis (10/29/2019)      Leukocytosis (10/29/2019)      Abdominal pain (10/29/2019)      Abnormal EKG (10/29/2019)      Elevated lactic acid level (10/29/2019)        Impression:   This 69 year old Caucasian female with past medical history of syncope in the past who presents with a chief complaint of abdominal discomfort with nausea and diarrhea which is since improved.  Leukocytosis, slightly elevated lactate noted, with CT scan reporting enteritis.  Troponin so far been negative although some T wave inversions were reported on EKG.  Suspect enteritis due to food poisoning is most likely cause.  Monitoring for atypical presentation of cardiac chest pain as well.  Admit to observation with telemetry.    Plan:    1. Acute Enteritis:   -CT scan reports a long segment of mild bowel wall thickening of the small bowel in the left lower quadrant with focal mesenteric edema and free fluid suggestive of enteritis  -White blood cell count is markedly elevated at 21, however the patient reports no further symptoms and reports feeling otherwise improved.  We will hold off on empiric antibiotics at this time but low threshold to add Zosyn or Cipro and Flagyl if worsening.  Recheck CBC tomorrow  -LFTs normal including lipase  -We will continue IV fluids today but stop this afternoon with normal saline  -Recheck lactate this afternoon, normally elevated 2.3 at intake.  -She tolerated clear liquids at breakfast and will advance to regular diet this afternoon.  -She has no abdominal pain at present  -She continues to tolerate p.o. and white count looks improved tomorrow consider discharge at that time  -As needed Zofran if needed for nausea  -If diarrhea worsens or recurs will consider stool studies or additional antibiotics.    2.  Near  syncope, suspect due to #1  -Monitor with telemetry  -IV fluids as above  -Advance diet as above    3. T-wave inversions on EKG  -Given findings and symptoms otherwise consistent with enteritis above, suspect less likely acute coronary process.  -Troponin negative x2 with third troponin pending this afternoon  -EKG does show some T wave inversions in leads II, III, V4-6.  -Continue telemetry for now but if otherwise improving, she may follow-up with her outpatient provider    4.  Leukocytosis  -Suspected #1  -Hold empiric antibiotics as she appears clinically improving   -Repeat CBC tomorrow    PPX: Lovenox SQ    CODE STATUS: FULL      History of Presenting Illness:  Tina Reilly is a 69 y.o. Caucasian female with past medical history of syncope, cholecystectomy but otherwise in good health who presented with complaints of near syncope with nausea, abdominal pain and diarrhea.  She reports she was visiting her son and went out to eat at a restaurant last night.  After finishing dinner at about 7:15 PM, which consisted of some fried fish, she developed abdominal discomfort, which she initially described as GERD-like in the evening, this progressed to a more intense " burning" type pain in the upper abdomen that radiated to the back.  She reports feeling close to passing out but did not actually pass out.  She denies vomiting but did report nausea.  She reports an episode of diarrhea with brown liquid stool yesterday but none further.  She denies any fevers or chills, no shortness of breath.  No chest pain.  She reports her husband had the same meal and he denies any symptoms.  No other sick contacts reported.  She reports feeling improved at this time.  Symptoms again to improve shortly after arrival to the emergency department.  She denies any further nausea abdominal pain or diarrhea and feels that she has appetite and tolerated clear liquids well this morning and would like to advance diet.  No other aggravating  or alleviating factors identified.      PMHX:  Past Medical History:   Diagnosis Date   ??? Syncope         PSHX:  Past Surgical History:   Procedure Laterality Date   ??? HX CHOLECYSTECTOMY         MEDICATIONS:  Prior to Admission medications    Medication Sig Start Date End Date Taking? Authorizing Provider   cholecalciferol, vitamin D3, (Vitamin D3) 50 mcg (2,000 unit) tab Take 3,000 Units by mouth.   Yes Provider, Historical   ascorbic acid, vitamin C, (Vitamin C) 500 mg tablet Take  by mouth daily.   Yes Provider, Historical   riboflavin, vitamin B2, 100 mg tablet Take  by mouth daily.   Yes Provider, Historical   vitamin D3/vitamin K2, MK4, (K2 PLUS D3 PO) Take  by mouth.   Yes Provider, Historical       ALLERGIES:  Allergies   Allergen Reactions   ??? Feathers Other (comments)   ??? [employer] Other (comments)   ??? Mold Other (comments)   ??? Soma [Carisoprodol] Rash   ??? Trimethoprim Rash       FHX:  Family History   Problem Relation Age of Onset   ??? Heart Attack Mother    ??? Stroke Mother    ??? Lung Cancer Father    ??? Dementia Father         had progressive weakness and dysphagia        SHX:  Social History     Socioeconomic History   ??? Marital status: MARRIED     Spouse name: Not on file   ??? Number of children: Not on file   ??? Years of education: Not on file   ??? Highest education level: Not on file   Occupational History   ??? Not on file   Tobacco Use   ??? Smoking status: Never Smoker   ??? Smokeless tobacco: Never Used   Substance and Sexual Activity   ??? Alcohol use: Yes     Alcohol/week: 7.0 standard drinks     Types: 7 Glasses of wine per week     Comment: reports about 5 oz wine most evenings.   ??? Drug use: Never   ??? Sexual activity: Not on file   Other Topics Concern   ??? Not on file   Social History Narrative   ??? Not on file     Social Determinants of Health     Financial Resource Strain:    ??? Difficulty of Paying Living Expenses:    Food Insecurity:    ??? Worried About Charity fundraiser in the Last Year:    ??? Ran Out  of Food in the Last Year:    Transportation Needs:    ??? Lack of Transportation (Medical):    ??? Lack  of Transportation (Non-Medical):    Physical Activity:    ??? Days of Exercise per Week:    ??? Minutes of Exercise per Session:    Stress:    ??? Feeling of Stress :    Social Connections:    ??? Frequency of Communication with Friends and Family:    ??? Frequency of Social Gatherings with Friends and Family:    ??? Attends Religious Services:    ??? Marine scientist or Organizations:    ??? Attends Music therapist:    ??? Marital Status:    Human resources officer Violence:    ??? Fear of Current or Ex-Partner:    ??? Emotionally Abused:    ??? Physically Abused:    ??? Sexually Abused:          Review of Systems:  General ROS: (+) for near syncope, Negative for fevers, chills  HEENT ROS: Negative for changes in hearing or vision, sore throat, trouble swallowing  CV ROS: Negative for chest pain, palpitations, shortness of breath,  Pulm ROS: Negative for cough, sputum production   GI ROS: (+) for GERD like symptoms, Nausea but no vomiting, Diarrhea, abd pain.   GU ROS: Negative for dysuria, hematuria, Musculoskeletal ROS: Negative for acute Back pain or joint pain  Lymph/HEME ROS: Negative for easy bruising, easy bleeding.  Skin ROS: Negative for rash, erythema, jaundice.  Neuro ROS: negative for focal numbness, weakness, tingling, paresthesias    All other systems reviewed and negative.        Physical Exam:     Visit Vitals  BP (!) 114/56 (BP 1 Location: Right arm, BP Patient Position: Supine)   Pulse 76   Temp 97.2 ??F (36.2 ??C)   Resp 17   Wt 62.6 kg (138 lb 0.1 oz)   SpO2 98%           General: Alert, Thin CF, pleasant to conversation, in no acute distress  HEENT:  White sclera. Oral Mucosa pink and moist.  Lymph: No palpable anterior cervical or supraclavicular lympadenopathy  CV: Regular rate and rhythm, no murmurs, rubs, gallops  Pulm: Lungs clear to auscultation bilaterally, no focal wheezes, crackles, rhonchi  GI: Soft,  nontender, nondistended, normal bowel sounds  Extremity: No clubbing, cyanosis, no pitting lower leg edema, capillary refill time intact to distal fingertips  Skin: Warm, dry  Neurologic: Grossly normal cranial nerve and peripheral motor function       24 Hour Results:  Recent Results (from the past 72 hour(s))   EKG, 12 LEAD, INITIAL    Collection Time: 10/29/19 12:40 AM   Result Value Ref Range    Ventricular Rate 86 BPM    Atrial Rate 86 BPM    P-R Interval 122 ms    QRS Duration 78 ms    Q-T Interval 384 ms    QTC Calculation (Bezet) 459 ms    Calculated P Axis 80 degrees    Calculated R Axis 78 degrees    Calculated T Axis -18 degrees    Diagnosis       Normal sinus rhythm  T wave abnormality, consider inferolateral ischemia  Abnormal ECG  No previous ECGs available  Confirmed by Massie Maroon, M.D., Syn[facility] Shadow [phone]) on 10/29/2019 7:36:53 AM     LACTIC ACID    Collection Time: 10/29/19 12:50 AM   Result Value Ref Range    Lactic Acid 2.3 (H) 0.4 - 2.0 mmol/L   CBC WITH AUTOMATED DIFF    Collection Time:  10/29/19 12:54 AM   Result Value Ref Range    WBC 21.0 (H) 4.0 - 11.0 1000/mm3    RBC 5.12 3.60 - 5.20 M/uL    HGB 16.2 13.0 - 17.2 gm/dl    HCT 47.9 37.0 - 50.0 %    MCV 93.6 80.0 - 98.0 fL    MCH 31.6 25.4 - 34.6 pg    MCHC 33.8 30.0 - 36.0 gm/dl    PLATELET 274 140 - 450 1000/mm3    MPV 10.2 (H) 6.0 - 10.0 fL    RDW-SD 48.2 (H) 36.4 - 46.3      NRBC 0 0 - 0      IMMATURE GRANULOCYTES 0.5 0.0 - 3.0 %    NEUTROPHILS 86.3 (H) 34 - 64 %    LYMPHOCYTES 7.5 (L) 28 - 48 %    MONOCYTES 4.8 1 - 13 %    EOSINOPHILS 0.7 0 - 5 %    BASOPHILS 0.2 0 - 3 %   METABOLIC PANEL, COMPREHENSIVE    Collection Time: 10/29/19 12:54 AM   Result Value Ref Range    Sodium 143 136 - 145 mEq/L    Potassium 3.7 3.5 - 5.1 mEq/L    Chloride 109 (H) 98 - 107 mEq/L    CO2 27 21 - 32 mEq/L    Glucose 171 (H) 74 - 106 mg/dl    BUN 16 7 - 25 mg/dl    Creatinine 1.0 0.6 - 1.3 mg/dl    GFR est AA >60.0      GFR est non-AA 58      Calcium 9.3 8.5 - 10.1 mg/dl     AST (SGOT) 27 15 - 37 U/L    ALT (SGPT) 29 12 - 78 U/L    Alk. phosphatase 57 45 - 117 U/L    Bilirubin, total 0.4 0.2 - 1.0 mg/dl    Protein, total 7.1 6.4 - 8.2 gm/dl    Albumin 4.0 3.4 - 5.0 gm/dl    Anion gap 7 5 - 15 mmol/L   LIPASE    Collection Time: 10/29/19 12:54 AM   Result Value Ref Range    Lipase 95 73 - 393 U/L   TROPONIN I    Collection Time: 10/29/19 12:54 AM   Result Value Ref Range    Troponin-I <0.015 0.000 - 0.045 ng/ml   CREATININE, POC    Collection Time: 10/29/19  1:36 AM   Result Value Ref Range    Creatinine 1.0 0.6 - 1.3 mg/dl   GLUCOSE, POC    Collection Time: 10/29/19  6:15 AM   Result Value Ref Range    Glucose (POC) 135 (H) 65 - 105 mg/dL   TROPONIN I    Collection Time: 10/29/19  6:48 AM   Result Value Ref Range    Troponin-I <0.015 0.000 - 0.045 ng/ml       Imaging:   CT ABD PELV W CONT   ??  INDICATION:  upper abd pain, marked leukocytosis of unclear etiology  ??  COMPARISON: None.  ??  TECHNIQUE: CT of the abdomen and pelvis was performed with nonionic intravenous  contrast with coronal and sagittal reformatted images.  ??  All CT exams at this facility use one or more dose reduction techniques  including automatic exposure control, mA/kV adjustment per patient's size, or  iterative reconstruction technique.  ??  FINDINGS:  ??  LOWER THORAX: Unremarkable   ??  LIVER: Normal.  ??  GALLBLADDER: Cholecystectomy.  ??  BILIARY: Minimal dilatation consistent with cholecystectomy.  ??  PANCREAS: Unremarkable.  ??  SPLEEN: Normal.  ??  ADRENALS: No adrenal mass or nodule.  ??  RENAL/BLADDER: Normal.  ??  REPRODUCTIVE: Unremarkable.  ??  GI TRACT: Diverticulosis without evidence of acute diverticulitis. Long segment  mild bowel wall thickening of the small bowel in the left lower quadrant with  focal mesenteric edema and free fluid suggestive of enteritis. No bowel  obstruction.  ??  LYMPH NODES: No lymphadenopathy.  ??  PERITONEUM: Trace ascites.   ??  RETROPERITONEUM: Unremarkable.  ??  VASCULATURE:  Atherosclerosis.  ??  ABDOMINAL WALL: Unremarkable.  ??  BONES: No acute osseous abnormality.  ??  ??  IMPRESSION  IMPRESSION: Acute enteritis.          CXR  PA & LAT:  Indication: Epigastric and back pain  PA and lateral chest   ??   The heart is normal in size. Lungs are clear and the bony thorax shows mild  dextroconvex thoracic scoliosis. No effusions or  other abnormalities are seen.  ??  IMPRESSION  Impression: Scoliosis. Otherwise normal study.    Signed By: Marzella Schlein, MD    October 29, 2019    09:17

## 2019-10-29 NOTE — Progress Notes (Signed)
 Patient admitted on 10/28/2019 from community via ED with   Chief Complaint   Patient presents with   . Abdominal Pain   . Diarrhea          The patient is being treated for    PMH:   Past Medical History:   Diagnosis Date   . Syncope         Treatment Team: Treatment Team: Attending Provider: Daneen Pac, MD; Hospitalist: Daneen Pac, MD; Primary Nurse: Deward Grate, RN      The patient has been admitted to the hospital 0 times in the past 12 months.    Previous 4 Admission Dates Admission and Discharge Diagnosis Interventions Barriers Disposition                                 Patient and Family/Caregivers Goals of Care: identify/treat cause of symptoms then continue vacation/return to home planned for Friday.    Caregivers Participating in Plan of Care/Discharge Plan with the patient: patient and husband    Tentative dc plan: DC to son's home to continue vacation then return to home on Friday    Anticipated DME needs for discharge: none    PRESCREENING COMPLETED FOR SNF no*  Will patient qualify for medicaid now or in the next 180 days? no  Has patient had covid vaccine? YES  Date and type if not in chart under immunization field PFIZER 2/25 AND 07/02/19        Does the patient have appropriate clothing available to be worn at discharge? YES      The patient and care participants are willing to travel NA area for discharge facility.  The patient and plan of care participants have been provided with a list of all available Rehab Facilities or Home Health agencies as applicable. CM will follow up with a list of facilities or agencies that are offer acceptance.    CM has disclosed any financial interest that [facility] may have with any facility or agency.    Anticipated Discharge Date: 6/23     The plan of care and discharge plan has been discussed with Camarato, Joseph, MD and all other appropriate providers and adjusted per interdisciplinary team recommendations and in discussion with the patient and the  patient designated Care Plan Participants.    Barriers to Healthcare Success/ Readmission Risk Factors: risk for fall/injury r/t history of syncope    Consults:  Palliative Care Consult Recommended: NO  Transitional Care Clinic Referral: NO  Transitional Nurse Navigator Referral: NO  Oncology Navigator Referral: NO  SW consulted: NO  [facility] Consulted: NO  Outside Hospital/Community Resources Referrals and Collaboration: NO    Food/Nutrition Needs:   NO                   Dietician Consulted: NO    RRAT Score: Low Risk            5 Total Score    5 Pt. Coverage (Medicare=5 , Medicaid, or Self-Pay=4)        Criteria that do not apply:    Has Seen PCP in Last 6 Months (Yes=3, No=0)    Married. Living with Significant Other. Assisted Living. LTAC. SNF. or   Rehab    Patient Length of Stay (>5 days = 3)    IP Visits Last 12 Months (1-3=4, 4=9, >4=11)    Charlson Comorbidity Score (Age + Comorbid  Conditions)           PCP: Montie Pizza   How do you get to your doctor appointments? Self/spouse    Specialists: none    Dialysis Unit: NA    Pharmacy[facility] [place].  WILL USE [facility] ON DISCHARGE.   Are there any medications that you have trouble paying for? NO   Any difficulty getting your medications? NO    DME available at Home:NONE    Home O2 L Flow:  NA            Home O2 Provider: NA    Home Environment and Prior Level of Function: Lives at [address], [address]  [place]  [id number]  Lives with husband in ranch home with basement, several steps for entry.  Responsibilities at home include adl/iadls    Prior to admission open services:  Home Health Agency NA  Personal Care Agency NA    Extended Emergency Contact Information  Primary Emergency Contact: Talyn, Eddie  Mobile Phone: [phone]  Relation: Spouse     Transportation: spouse will transport home    Therapy Recommendations:    OT =     PT =     SLP =       RT Home O2 Evaluation =       Wound Care =     Case Management Assessment    ABUSE/NEGLECT SCREENING   Physical Abuse/Neglect: Denies   Sexual Abuse: Denies   Sexual Abuse: Denies   Other Abuse/Issues: Denies          PRIMARY DECISION MAKER    Patient makes own decisions                               CARE MANAGEMENT INTERVENTIONS   Readmission Interview Completed: Not Applicable   PCP Verified by CM: Yes [facility])   Last Visit to PCP:  (October 2020)   Palliative Care Criteria Met (RRAT>21 & CHF Dx)?: No   Mode of Transport at Discharge: Other (see comment) (husband)       Transition of Care Consult (CM Consult): Discharge Planning               Discharge Durable Medical Equipment: No   Physical Therapy Consult: No   Occupational Therapy Consult: No   Speech Therapy Consult: No   Current Support Network: Lives with Spouse   Reason for Referral: DCP Rounds   History Provided By: Patient, Spouse, Medical Record   Patient Orientation: Alert and Oriented, Person, Place, Situation, Self   Cognition: Alert   Support System Response: Concerned, Cooperative   Previous Living Arrangement: Lives with Family Independent   Home Accessibility: Steps   Prior Functional Level: Independent in ADLs/IADLs   Current Functional Level: Independent in ADLs/IADLs       Can patient return to prior living arrangement: Yes   Ability to make needs known:: Good   Family able to assist with home care needs:: Yes               Types of Needs Identified: Disease Management Education, Treatment Education, Emotional Support   Anticipated Discharge Needs: Other (see comment) (NONE)   Confirm Follow Up Transport: Family                  DISCHARGE LOCATION   Discharge Placement: Home

## 2019-10-30 LAB — BASIC METABOLIC PANEL
Anion Gap: 3 mmol/L — ABNORMAL LOW (ref 5–15)
BUN: 14 mg/dl (ref 7–25)
CO2: 28 mEq/L (ref 21–32)
Calcium: 8.6 mg/dl (ref 8.5–10.1)
Chloride: 114 mEq/L — ABNORMAL HIGH (ref 98–107)
Creatinine: 0.7 mg/dl (ref 0.6–1.3)
EGFR IF NonAfrican American: >60
GFR African American: >60
Glucose: 87 mg/dl (ref 74–106)
Potassium: 4 mEq/L (ref 3.5–5.1)
Sodium: 145 mEq/L (ref 136–145)

## 2019-10-30 LAB — CBC WITH AUTO DIFFERENTIAL
Basophils %: 0.3 % (ref 0–3)
Eosinophils %: 6.1 % — ABNORMAL HIGH (ref 0–5)
Hematocrit: 39 % (ref 37.0–50.0)
Hemoglobin: 12.8 gm/dl — ABNORMAL LOW (ref 13.0–17.2)
Immature Granulocytes: 0.3 % (ref 0.0–3.0)
Lymphocytes %: 28.4 % (ref 28–48)
MCH: 31.4 pg (ref 25.4–34.6)
MCHC: 32.8 gm/dl (ref 30.0–36.0)
MCV: 95.8 fL (ref 80.0–98.0)
MPV: 10.6 fL — ABNORMAL HIGH (ref 6.0–10.0)
Monocytes %: 6.1 % (ref 1–13)
Neutrophils %: 58.8 % (ref 34–64)
Nucleated RBCs: 0 (ref 0–0)
Platelets: 203 10*3/uL (ref 140–450)
RBC: 4.07 M/uL (ref 3.60–5.20)
RDW-SD: 50.4 — ABNORMAL HIGH (ref 36.4–46.3)
WBC: 10.6 10*3/uL (ref 4.0–11.0)

## 2019-10-30 LAB — CBC WITH AUTOMATED DIFF
BASOPHILS: 0.3 % (ref 0–3)
EOSINOPHILS: 6.1 % — ABNORMAL HIGH (ref 0–5)
HCT: 39 % (ref 37.0–50.0)
HGB: 12.8 gm/dl — ABNORMAL LOW (ref 13.0–17.2)
IMMATURE GRANULOCYTES: 0.3 % (ref 0.0–3.0)
LYMPHOCYTES: 28.4 % (ref 28–48)
MCH: 31.4 pg (ref 25.4–34.6)
MCHC: 32.8 gm/dl (ref 30.0–36.0)
MCV: 95.8 fL (ref 80.0–98.0)
MONOCYTES: 6.1 % (ref 1–13)
MPV: 10.6 fL — ABNORMAL HIGH (ref 6.0–10.0)
NEUTROPHILS: 58.8 % (ref 34–64)
NRBC: 0 (ref 0–0)
PLATELET: 203 10*3/uL (ref 140–450)
RBC: 4.07 M/uL (ref 3.60–5.20)
RDW-SD: 50.4 — ABNORMAL HIGH (ref 36.4–46.3)
WBC: 10.6 10*3/uL (ref 4.0–11.0)

## 2019-10-30 LAB — METABOLIC PANEL, BASIC
Anion gap: 3 mmol/L — ABNORMAL LOW (ref 5–15)
BUN: 14 mg/dl (ref 7–25)
CO2: 28 mEq/L (ref 21–32)
Calcium: 8.6 mg/dl (ref 8.5–10.1)
Chloride: 114 mEq/L — ABNORMAL HIGH (ref 98–107)
Creatinine: 0.7 mg/dl (ref 0.6–1.3)
GFR est AA: >60
GFR est non-AA: >60
Glucose: 87 mg/dl (ref 74–106)
Potassium: 4 mEq/L (ref 3.5–5.1)
Sodium: 145 mEq/L (ref 136–145)

## 2019-10-30 NOTE — Discharge Summary (Signed)
Hospitalist Discharge Summary      Discharge Summary   Admit Date: 10/28/2019  Discharge Date:  10/30/19      Patient ID:  Tina Reilly  69 y.o.  07/18/1950    Chief Complaint   Patient presents with   ??? Abdominal Pain   ??? Diarrhea       Discharge Diagnoses  1. Acute Enteritis:   -CT scan reports a long segment of mild bowel wall thickening of the small bowel in the left lower quadrant with focal mesenteric edema and free fluid suggestive of enteritis  -White blood cell count is markedly elevated at 21, however the patient reports no further symptoms and reports feeling otherwise improved.  We will hold off on empiric antibiotics at this time but low threshold to add Zosyn or Cipro and Flagyl if worsening.  WBC normalized at 10.6 on 6/23  -LFTs normal including lipase  -Lactate intially elevated 2.3 at intake, normalized on 6/22 afternoon at 1.0  -She has tolerated a regular diet, without further nausea, diarrhea, or abd pain  -She otherwise reports feeling well.  -Suspect food poisoning as cause of acute enteritis.   ??  2.  Near syncope, suspect due to #1  -no further symptoms  ??  3. T-wave inversions on EKG  -Given findings and symptoms otherwise consistent with enteritis above, suspect less likely acute coronary process.  -Troponin negative x3  -EKG does show some T wave inversions in leads II, III, V4-6.  -F/up with PCP, but favor #1 as cause of acute presentation.  ??  4.  Leukocytosis-Resolved  -Due to #1  -Held empiric antibiotics as she quickly improved clinically  -CBC has normalized.    Current Discharge Medication List      CONTINUE these medications which have NOT CHANGED    Details   cholecalciferol, vitamin D3, (Vitamin D3) 50 mcg (2,000 unit) tab Take 3,000 Units by mouth.      ascorbic acid, vitamin C, (Vitamin C) 500 mg tablet Take  by mouth daily.      riboflavin, vitamin B2, 100 mg tablet Take  by mouth daily.      vitamin D3/vitamin K2, MK4, (K2 PLUS D3 PO) Take  by mouth.               Initial  presentation:  From HPI:  Tina Reilly is a 69 y.o. Caucasian female with past medical history of syncope, cholecystectomy but otherwise in good health who presented with complaints of near syncope with nausea, abdominal pain and diarrhea.  She reports she was visiting her son and went out to eat at a restaurant last night.  After finishing dinner at about 7:15 PM, which consisted of some fried fish, she developed abdominal discomfort, which she initially described as GERD-like in the evening, this progressed to a more intense " burning" type pain in the upper abdomen that radiated to the back.  She reports feeling close to passing out but did not actually pass out.  She denies vomiting but did report nausea.  She reports an episode of diarrhea with brown liquid stool yesterday but none further.  She denies any fevers or chills, no shortness of breath.  No chest pain.  She reports her husband had the same meal and he denies any symptoms.  No other sick contacts reported.  She reports feeling improved at this time.  Symptoms again to improve shortly after arrival to the emergency department.  She denies any further nausea abdominal pain or  diarrhea and feels that she has appetite and tolerated clear liquids well this morning and would like to advance diet.  No other aggravating or alleviating factors identified.  ??  She was admitted to observation with plans for clear liquid diet to advance to regular diet quickly as symptoms had otherwise seem to be improved.  To monitor lactate and leukocytosis, but symptoms overall seem to improved after arrival to the emergency department.  Telemetry was continued.     Hospital Course:   Diet was quickly able to be advanced to regular diet which the patient reports tolerating without further abdominal discomfort nausea, vomiting or diarrhea.  Lactate quickly normalized on the afternoon of the 22nd.  She had no further symptoms otherwise reports feeling well.  White blood cell  count is normalized in the morning the 23rd.  She otherwise appears well and is plan for discharge today.  Suspect symptoms due to food poisoning.  She should follow-up with her primary care provider regarding her EKG findings although do not believe this was the cause of her present symptoms    Imaging:  XR CHEST PA LAT    Result Date: 10/29/2019  Indication: Epigastric and back pain PA and lateral chest  The heart is normal in size. Lungs are clear and the bony thorax shows mild dextroconvex thoracic scoliosis. No effusions or  other abnormalities are seen.     Impression: Scoliosis. Otherwise normal study.         CT ABD PELV W CONT    Result Date: 10/29/2019  EXAMINATION: CT ABD PELV W CONT INDICATION:  upper abd pain, marked leukocytosis of unclear etiology COMPARISON: None. TECHNIQUE: CT of the abdomen and pelvis was performed with nonionic intravenous contrast with coronal and sagittal reformatted images. All CT exams at this facility use one or more dose reduction techniques including automatic exposure control, mA/kV adjustment per patient's size, or iterative reconstruction technique. FINDINGS: LOWER THORAX: Unremarkable LIVER: Normal. GALLBLADDER: Cholecystectomy. BILIARY: Minimal dilatation consistent with cholecystectomy. PANCREAS: Unremarkable. SPLEEN: Normal. ADRENALS: No adrenal mass or nodule. RENAL/BLADDER: Normal. REPRODUCTIVE: Unremarkable. GI TRACT: Diverticulosis without evidence of acute diverticulitis. Long segment mild bowel wall thickening of the small bowel in the left lower quadrant with focal mesenteric edema and free fluid suggestive of enteritis. No bowel obstruction. LYMPH NODES: No lymphadenopathy. PERITONEUM: Trace ascites. RETROPERITONEUM: Unremarkable. VASCULATURE: Atherosclerosis. ABDOMINAL WALL: Unremarkable. BONES: No acute osseous abnormality.     IMPRESSION: Acute enteritis. Preliminary interpretation provided by [facility]. WORKSTATION ID: [id number]       Physical Exam on  Discharge:  Visit Vitals  BP 110/65 (BP 1 Location: Left arm)   Pulse 62   Temp 97.8 ??F (36.6 ??C)   Resp 16   Wt 65.5 kg (144 lb 6.4 oz)   SpO2 97%     General: Alert, Thin CF, pleasant to conversation, in no acute distress  CV: Regular rate and rhythm, no murmurs, rubs, gallops  Pulm: Lungs clear to auscultation bilaterally, no focal wheezes, crackles, rhonchi  GI: Soft, nontender, nondistended, normal bowel sounds  Extremity: No clubbing, cyanosis, no pitting lower leg edema, capillary refill time intact to distal fingertips  Skin: Warm, dry      Most Recent BMP and CBC:    Lab Results   Component Value Date/Time    Sodium 145 10/30/2019 12:24 AM    Potassium 4.0 10/30/2019 12:24 AM    Chloride 114 (H) 10/30/2019 12:24 AM    CO2 28 10/30/2019 12:24 AM    Anion  gap 3 (L) 10/30/2019 12:24 AM    Glucose 87 10/30/2019 12:24 AM    BUN 14 10/30/2019 12:24 AM    Creatinine 0.7 10/30/2019 12:24 AM    GFR est AA >60.0 10/30/2019 12:24 AM    GFR est non-AA >60 10/30/2019 12:24 AM    Calcium 8.6 10/30/2019 12:24 AM      Lab Results   Component Value Date/Time    WBC 10.6 10/30/2019 12:24 AM    HGB 12.8 (L) 10/30/2019 12:24 AM    HCT 39.0 10/30/2019 12:24 AM    PLATELET 203 10/30/2019 12:24 AM    MCV 95.8 10/30/2019 12:24 AM          Condition at discharge: Stable.     Disposition: Home    PCP:  Dr. Laurann Montana, F/up in 1 week.      Total time for DC was 28 minutes.      Scarlette Ar, MD  October 30, 2019  08:40

## 2019-10-30 NOTE — Progress Notes (Signed)
Discharge teaching reviewed with husband and patient. No new medications, no home care needs. All questions answered at this time. Teach back verified. Patient discharged with all of patient belongings.

## 2019-10-30 NOTE — Progress Notes (Signed)
Discharge Plan: HOME    Discharge Date:     10/30/2019     Assisted Living Facility:    NO    Is patient going to snf? NO    Home Health Needed:     NO    DME needed and ordered for Discharge:    NONE    TCC Referral:     NO    Medication Assistance given    NO       meds given (please list)     NONE    Change(MDC/SSDI) Referral/outcome    NA     Transportation: Family    Discussed discharge with patient, husband, and nurse [place].

## 2019-11-04 MED ORDER — IOPAMIDOL 61 % IV SOLN
300 mg iodine /mL (61 %) | Freq: Once | INTRAVENOUS | Status: AC
Start: 2019-11-04 — End: 2019-10-29
  Administered 2019-10-30: 03:00:00 via INTRAVENOUS

## 2020-02-01 DIAGNOSIS — Z23 Encounter for immunization: Secondary | ICD-10-CM | POA: Diagnosis not present

## 2020-03-12 DIAGNOSIS — Z23 Encounter for immunization: Secondary | ICD-10-CM | POA: Diagnosis not present

## 2020-03-18 DIAGNOSIS — H5213 Myopia, bilateral: Secondary | ICD-10-CM | POA: Diagnosis not present

## 2020-03-18 DIAGNOSIS — H52203 Unspecified astigmatism, bilateral: Secondary | ICD-10-CM | POA: Diagnosis not present

## 2020-03-18 DIAGNOSIS — H2513 Age-related nuclear cataract, bilateral: Secondary | ICD-10-CM | POA: Diagnosis not present

## 2020-03-18 DIAGNOSIS — H524 Presbyopia: Secondary | ICD-10-CM | POA: Diagnosis not present

## 2020-04-27 DIAGNOSIS — Z20822 Contact with and (suspected) exposure to covid-19: Secondary | ICD-10-CM | POA: Diagnosis not present

## 2020-09-25 DIAGNOSIS — Z23 Encounter for immunization: Secondary | ICD-10-CM | POA: Diagnosis not present

## 2020-12-18 DIAGNOSIS — M6701 Short Achilles tendon (acquired), right ankle: Secondary | ICD-10-CM | POA: Diagnosis not present

## 2020-12-18 DIAGNOSIS — M7731 Calcaneal spur, right foot: Secondary | ICD-10-CM | POA: Diagnosis not present

## 2020-12-18 DIAGNOSIS — M7661 Achilles tendinitis, right leg: Secondary | ICD-10-CM | POA: Diagnosis not present

## 2021-01-25 DIAGNOSIS — E785 Hyperlipidemia, unspecified: Secondary | ICD-10-CM | POA: Diagnosis not present

## 2021-01-25 DIAGNOSIS — Z0000 Encounter for general adult medical examination without abnormal findings: Secondary | ICD-10-CM | POA: Diagnosis not present

## 2021-01-25 DIAGNOSIS — E559 Vitamin D deficiency, unspecified: Secondary | ICD-10-CM | POA: Diagnosis not present

## 2021-01-25 DIAGNOSIS — M8588 Other specified disorders of bone density and structure, other site: Secondary | ICD-10-CM | POA: Diagnosis not present

## 2021-02-09 DIAGNOSIS — Z780 Asymptomatic menopausal state: Secondary | ICD-10-CM | POA: Diagnosis not present

## 2021-02-09 DIAGNOSIS — M810 Age-related osteoporosis without current pathological fracture: Secondary | ICD-10-CM | POA: Diagnosis not present

## 2021-02-09 DIAGNOSIS — M85852 Other specified disorders of bone density and structure, left thigh: Secondary | ICD-10-CM | POA: Diagnosis not present

## 2021-02-25 DIAGNOSIS — Z23 Encounter for immunization: Secondary | ICD-10-CM | POA: Diagnosis not present

## 2021-03-12 DIAGNOSIS — Z23 Encounter for immunization: Secondary | ICD-10-CM | POA: Diagnosis not present

## 2022-03-04 DIAGNOSIS — Z23 Encounter for immunization: Secondary | ICD-10-CM | POA: Diagnosis not present

## 2022-03-17 DIAGNOSIS — Z23 Encounter for immunization: Secondary | ICD-10-CM | POA: Diagnosis not present

## 2022-08-25 DIAGNOSIS — H5712 Ocular pain, left eye: Secondary | ICD-10-CM | POA: Diagnosis not present

## 2022-08-25 DIAGNOSIS — H524 Presbyopia: Secondary | ICD-10-CM | POA: Diagnosis not present

## 2022-08-25 DIAGNOSIS — H52203 Unspecified astigmatism, bilateral: Secondary | ICD-10-CM | POA: Diagnosis not present

## 2022-08-25 DIAGNOSIS — H5213 Myopia, bilateral: Secondary | ICD-10-CM | POA: Diagnosis not present

## 2022-08-25 DIAGNOSIS — H2513 Age-related nuclear cataract, bilateral: Secondary | ICD-10-CM | POA: Diagnosis not present

## 2023-03-08 DIAGNOSIS — Z23 Encounter for immunization: Secondary | ICD-10-CM | POA: Diagnosis not present

## 2023-03-16 DIAGNOSIS — M79671 Pain in right foot: Secondary | ICD-10-CM | POA: Diagnosis not present

## 2023-03-24 DIAGNOSIS — Z23 Encounter for immunization: Secondary | ICD-10-CM | POA: Diagnosis not present

## 2023-10-31 DIAGNOSIS — W228XXA Striking against or struck by other objects, initial encounter: Secondary | ICD-10-CM | POA: Diagnosis not present

## 2023-10-31 DIAGNOSIS — S0990XA Unspecified injury of head, initial encounter: Secondary | ICD-10-CM | POA: Diagnosis not present

## 2023-12-11 DIAGNOSIS — E559 Vitamin D deficiency, unspecified: Secondary | ICD-10-CM | POA: Diagnosis not present

## 2023-12-11 DIAGNOSIS — M8588 Other specified disorders of bone density and structure, other site: Secondary | ICD-10-CM | POA: Diagnosis not present

## 2023-12-11 DIAGNOSIS — G250 Essential tremor: Secondary | ICD-10-CM | POA: Diagnosis not present

## 2023-12-11 DIAGNOSIS — R030 Elevated blood-pressure reading, without diagnosis of hypertension: Secondary | ICD-10-CM | POA: Diagnosis not present

## 2023-12-11 DIAGNOSIS — E785 Hyperlipidemia, unspecified: Secondary | ICD-10-CM | POA: Diagnosis not present

## 2023-12-11 DIAGNOSIS — Z860100 Personal history of colon polyps, unspecified: Secondary | ICD-10-CM | POA: Diagnosis not present

## 2023-12-18 DIAGNOSIS — H43393 Other vitreous opacities, bilateral: Secondary | ICD-10-CM | POA: Diagnosis not present

## 2023-12-18 DIAGNOSIS — H52203 Unspecified astigmatism, bilateral: Secondary | ICD-10-CM | POA: Diagnosis not present

## 2023-12-18 DIAGNOSIS — H5213 Myopia, bilateral: Secondary | ICD-10-CM | POA: Diagnosis not present

## 2023-12-18 DIAGNOSIS — H04123 Dry eye syndrome of bilateral lacrimal glands: Secondary | ICD-10-CM | POA: Diagnosis not present

## 2023-12-18 DIAGNOSIS — H524 Presbyopia: Secondary | ICD-10-CM | POA: Diagnosis not present

## 2023-12-18 DIAGNOSIS — H2513 Age-related nuclear cataract, bilateral: Secondary | ICD-10-CM | POA: Diagnosis not present

## 2023-12-18 DIAGNOSIS — Z83511 Family history of glaucoma: Secondary | ICD-10-CM | POA: Diagnosis not present

## 2023-12-28 DIAGNOSIS — E559 Vitamin D deficiency, unspecified: Secondary | ICD-10-CM | POA: Diagnosis not present

## 2023-12-28 DIAGNOSIS — E785 Hyperlipidemia, unspecified: Secondary | ICD-10-CM | POA: Diagnosis not present

## 2024-02-21 DIAGNOSIS — Z23 Encounter for immunization: Secondary | ICD-10-CM | POA: Diagnosis not present

## 2024-04-10 DIAGNOSIS — Z23 Encounter for immunization: Secondary | ICD-10-CM | POA: Diagnosis not present
